# Patient Record
Sex: Female | Born: 1950 | Race: White | Hispanic: No | Marital: Single | Smoking: Former smoker
Health system: Southern US, Community
[De-identification: ages and names within clinical notes are randomized; demographics above are authoritative.]

## PROBLEM LIST (undated history)

## (undated) DIAGNOSIS — C801 Malignant (primary) neoplasm, unspecified: Secondary | ICD-10-CM

## (undated) HISTORY — PX: EYE SURGERY: SHX253

## (undated) HISTORY — PX: TUBAL LIGATION: SHX77

## (undated) HISTORY — PX: KIDNEY SURGERY: SHX687

## (undated) HISTORY — PX: ABDOMINAL HYSTERECTOMY: SHX81

## (undated) HISTORY — PX: CHOLECYSTECTOMY: SHX55

---

## 2019-08-30 ENCOUNTER — Telehealth: Payer: Self-pay | Admitting: Oncology

## 2019-08-30 NOTE — Telephone Encounter (Signed)
Received a referral from [facility] in [facility] for metastatic urothelial carcinoma. I cld and spoke to the pt's daughter in law and schedule Katelyn Sutton to see Dr. Alen Blew on 4/29 at 2pm.

## 2019-09-02 ENCOUNTER — Emergency Department (HOSPITAL_COMMUNITY)
Admission: EM | Admit: 2019-09-02 | Discharge: 2019-09-02 | Disposition: A | Discharge status: Home / Self Care | Payer: Medicare HMO | Attending: Emergency Medicine | Admitting: Emergency Medicine

## 2019-09-02 ENCOUNTER — Emergency Department (HOSPITAL_BASED_OUTPATIENT_CLINIC_OR_DEPARTMENT_OTHER)
Admit: 2019-09-02 | Discharge: 2019-09-02 | Disposition: A | Discharge status: Home / Self Care | Payer: Medicare HMO | Attending: Emergency Medicine | Admitting: Emergency Medicine

## 2019-09-02 ENCOUNTER — Emergency Department (HOSPITAL_COMMUNITY): Payer: Medicare HMO

## 2019-09-02 ENCOUNTER — Other Ambulatory Visit: Payer: Self-pay

## 2019-09-02 ENCOUNTER — Encounter (HOSPITAL_COMMUNITY): Payer: Self-pay

## 2019-09-02 DIAGNOSIS — Z79899 Other long term (current) drug therapy: Secondary | ICD-10-CM | POA: Insufficient documentation

## 2019-09-02 DIAGNOSIS — M7989 Other specified soft tissue disorders: Secondary | ICD-10-CM

## 2019-09-02 DIAGNOSIS — R2243 Localized swelling, mass and lump, lower limb, bilateral: Secondary | ICD-10-CM | POA: Diagnosis not present

## 2019-09-02 DIAGNOSIS — Z85841 Personal history of malignant neoplasm of brain: Secondary | ICD-10-CM | POA: Diagnosis not present

## 2019-09-02 DIAGNOSIS — R0602 Shortness of breath: Secondary | ICD-10-CM | POA: Diagnosis not present

## 2019-09-02 DIAGNOSIS — Z923 Personal history of irradiation: Secondary | ICD-10-CM | POA: Diagnosis not present

## 2019-09-02 DIAGNOSIS — R5383 Other fatigue: Secondary | ICD-10-CM | POA: Diagnosis not present

## 2019-09-02 DIAGNOSIS — Z87891 Personal history of nicotine dependence: Secondary | ICD-10-CM | POA: Insufficient documentation

## 2019-09-02 DIAGNOSIS — R600 Localized edema: Secondary | ICD-10-CM

## 2019-09-02 DIAGNOSIS — R609 Edema, unspecified: Secondary | ICD-10-CM

## 2019-09-02 HISTORY — DX: Malignant (primary) neoplasm, unspecified: C80.1

## 2019-09-02 LAB — CBC WITH DIFFERENTIAL/PLATELET
Abs Immature Granulocytes: 0.57 10*3/uL — ABNORMAL HIGH (ref 0.00–0.07)
Basophils Absolute: 0 10*3/uL (ref 0.0–0.1)
Basophils Relative: 0 %
Eosinophils Absolute: 0.1 10*3/uL (ref 0.0–0.5)
Eosinophils Relative: 1 %
HCT: 33.9 % — ABNORMAL LOW (ref 36.0–46.0)
Hemoglobin: 10.9 g/dL — ABNORMAL LOW (ref 12.0–15.0)
Immature Granulocytes: 5 %
Lymphocytes Relative: 8 %
Lymphs Abs: 0.9 10*3/uL (ref 0.7–4.0)
MCH: 30.1 pg (ref 26.0–34.0)
MCHC: 32.2 g/dL (ref 30.0–36.0)
MCV: 93.6 fL (ref 80.0–100.0)
Monocytes Absolute: 0.9 10*3/uL (ref 0.1–1.0)
Monocytes Relative: 8 %
Neutro Abs: 8.7 10*3/uL — ABNORMAL HIGH (ref 1.7–7.7)
Neutrophils Relative %: 78 %
Platelets: 159 10*3/uL (ref 150–400)
RBC: 3.62 MIL/uL — ABNORMAL LOW (ref 3.87–5.11)
RDW: 15.8 % — ABNORMAL HIGH (ref 11.5–15.5)
WBC: 11.1 10*3/uL — ABNORMAL HIGH (ref 4.0–10.5)
nRBC: 0 % (ref 0.0–0.2)

## 2019-09-02 MED ORDER — FUROSEMIDE 20 MG PO TABS
20.0000 mg | ORAL_TABLET | Freq: Every day | ORAL | 0 refills | Status: DC
Start: 2019-09-02 — End: 2019-09-08

## 2019-09-02 MED ORDER — FUROSEMIDE 10 MG/ML IJ SOLN
20.0000 mg | Freq: Once | INTRAMUSCULAR | Status: AC
  Administered 2019-09-02: 20 mg via INTRAVENOUS
  Filled 2019-09-02: qty 4

## 2019-09-02 MED ORDER — POTASSIUM CHLORIDE CRYS ER 20 MEQ PO TBCR
20.0000 meq | EXTENDED_RELEASE_TABLET | Freq: Every day | ORAL | 0 refills | Status: DC
Start: 2019-09-02 — End: 2019-09-08

## 2019-09-02 MED ORDER — IOHEXOL 350 MG/ML SOLN
100.0000 mL | Freq: Once | INTRAVENOUS | Status: AC | PRN
  Administered 2019-09-02: 100 mL via INTRAVENOUS

## 2019-09-02 NOTE — ED Provider Notes (Signed)
[facility] DEPT Provider Note   CSN: [id number] Arrival date & time: 09/02/19  1608     History Chief Complaint  Patient presents with  . Abnormal Lab    Katelyn Sutton is a 69 y.o. female.  Patient is a 69 year old female who presents with leg swelling and some shortness of breath.  She has a history of metastatic transitional urothelial carcinoma.  She has brain metastases.  She has been treated in [facility].  She recently completed a 10-day course of whole brain radiation.  Following that, the decision was made by her family to move her out here to [place].  She has been driving for the last 3 days from [place] and arrived here yesterday.  She said that she has been having worsening leg swelling since that time.  Her son says the leg swelling actually start while she was getting the radiation therapy.  She does have some shortness of breath at times although mostly when she is fatigued.  Over the last 2 weeks she has had some episodes where she gets chest tightness, mostly when she is fatigued.  It does not seem to be pleuritic.  It does not seem to be on exertion per se but more when she is fatigued.  Its bilateral.  She denies any fevers.  No cough or cold symptoms.  She is had a weight gain from 124 pounds to 141 over the last couple of weeks.  Of note, she was treated for hyponatremia as well while she was hospitalized.        Past Medical History:  Diagnosis Date  . Cancer ([place])     There are no problems to display for this patient.   Past Surgical History:  Procedure Laterality Date  . ABDOMINAL HYSTERECTOMY    . CHOLECYSTECTOMY    . EYE SURGERY    . KIDNEY SURGERY    . TUBAL LIGATION       OB History   No obstetric history on file.     Family History  Problem Relation Age of Onset  . Stroke Mother     Social History   Tobacco Use  . Smoking status: Former Research scientist (life sciences)  . Smokeless tobacco: Never Used  Substance Use  Topics  . Alcohol use: Yes  . Drug use: Never    Home Medications Prior to Admission medications   Medication Sig Start Date End Date Taking? Authorizing Provider  AMINO ACIDS COMPLEX PO Take 1 capsule by mouth daily.   Yes [provider]  dexamethasone (DECADRON) 2 MG tablet Take 2 mg by mouth every evening. 08/29/19  Yes [provider]  dexamethasone (DECADRON) 4 MG tablet Take 4 mg by mouth daily at 6 (six) AM. 08/18/19  Yes [provider]  famotidine (PEPCID) 20 MG tablet Take 20 mg by mouth 2 (two) times daily. 08/18/19  Yes [provider]  Misc Natural Products (JOINT SUPPORT COMPLEX PO) Take 1 capsule by mouth daily.   Yes [provider]  Multiple Vitamin (MULTI-VITAMIN) tablet Take 1 tablet by mouth daily.   Yes [provider]  traZODone (DESYREL) 50 MG tablet Take 50 mg by mouth at bedtime. 08/26/19  Yes [provider]  Turmeric (QC TUMERIC COMPLEX PO) Take 1 capsule by mouth at bedtime.   Yes [provider]  furosemide (LASIX) 20 MG tablet Take 1 tablet (20 mg total) by mouth daily for 3 days. 09/02/19 09/05/19  Malvin Johns, MD  potassium chloride  SA (KLOR-CON) 20 MEQ tablet Take 1 tablet (20 mEq total) by mouth daily. 09/02/19   Malvin Johns, MD    Allergies    Morphine and related  Review of Systems   Review of Systems  Constitutional: Positive for activity change, appetite change and fatigue. Negative for chills, diaphoresis and fever.  HENT: Negative for congestion, rhinorrhea and sneezing.   Eyes: Negative.   Respiratory: Positive for chest tightness and shortness of breath. Negative for cough.   Cardiovascular: Positive for leg swelling. Negative for chest pain.  Gastrointestinal: Negative for abdominal pain, blood in stool, diarrhea, nausea and vomiting.  Genitourinary: Negative for difficulty urinating, flank pain, frequency and hematuria.  Musculoskeletal: Negative for arthralgias and back  pain.  Skin: Negative for rash.  Neurological: Positive for weakness (generalized). Negative for dizziness, speech difficulty, numbness and headaches.    Physical Exam Updated Vital Signs BP 112/74   Pulse 70   Temp 98.2 F (36.8 C) (Oral)   Resp 15   Ht 5\' 7"  (1.702 m)   Wt 65.8 kg   SpO2 94%   BMI 22.71 kg/m   Physical Exam Constitutional:      Appearance: She is well-developed.  HENT:     Head: Normocephalic and atraumatic.  Eyes:     Pupils: Pupils are equal, round, and reactive to light.  Cardiovascular:     Rate and Rhythm: Normal rate and regular rhythm.     Heart sounds: Normal heart sounds.  Pulmonary:     Effort: Pulmonary effort is normal. No respiratory distress.     Breath sounds: Normal breath sounds. No wheezing or rales.  Chest:     Chest wall: No tenderness.  Abdominal:     General: Bowel sounds are normal.     Palpations: Abdomen is soft.     Tenderness: There is no abdominal tenderness. There is no guarding or rebound.  Musculoskeletal:        General: Normal range of motion.     Cervical back: Normal range of motion and neck supple.     Right lower leg: Edema present.     Left lower leg: Edema present.     Comments: 2-3+ pitting edema into the bilateral lower extremities, pedal pulses are intact, no warmth or erythema  Lymphadenopathy:     Cervical: No cervical adenopathy.  Skin:    General: Skin is warm and dry.     Findings: No rash.  Neurological:     Mental Status: She is alert and oriented to person, place, and time.     ED Results / Procedures / Treatments   Labs (all labs ordered are listed, but only abnormal results are displayed) Labs Reviewed  CBC WITH DIFFERENTIAL/PLATELET - Abnormal; Notable for the following components:      Result Value   WBC 11.1 (*)    RBC 3.62 (*)    Hemoglobin 10.9 (*)    HCT 33.9 (*)    RDW 15.8 (*)    Neutro Abs 8.7 (*)    Abs Immature Granulocytes 0.57 (*)    All other components within normal  limits    EKG EKG Interpretation  Date/Time:  Friday September 02 2019 17:44:45 EDT Ventricular Rate:  76 PR Interval:    QRS Duration: 93 QT Interval:  384 QTC Calculation: 432 R Axis:   66 Text Interpretation: Sinus rhythm Probable left atrial enlargement Anteroseptal infarct, age indeterminate No old tracing to compare Confirmed by Malvin Johns [phone]) on 09/02/2019 6:50:36 PM  Radiology CT Angio Chest PE W/Cm &/Or Wo Cm  Result Date: 09/02/2019 CLINICAL DATA:  Shortness of breath, elevated D-dimer, bilateral lower extremity edema, history of brain cancer with liver and lung metastases EXAM: CT ANGIOGRAPHY CHEST WITH CONTRAST TECHNIQUE: Multidetector CT imaging of the chest was performed using the standard protocol during bolus administration of intravenous contrast. Multiplanar CT image reconstructions and MIPs were obtained to evaluate the vascular anatomy. CONTRAST:  168mL OMNIPAQUE IOHEXOL 350 MG/ML SOLN COMPARISON:  None. FINDINGS: Cardiovascular: This is a technically adequate evaluation of the pulmonary vasculature. No filling defects or pulmonary emboli. The heart is unremarkable without pericardial effusion. Normal caliber of the thoracic aorta. Mediastinum/Nodes: Left hilar adenopathy measures up to 2.4 cm in short axis. Thyroid, trachea, and esophagus are unremarkable. Lungs/Pleura: There is a 9 mm right middle lobe pulmonary nodule reference image 85. A large cavitating mass within the left upper lobe measures up to 4.3 x 3.0 cm. Findings are consistent with metastatic disease. There is upper lobe predominant emphysema. No airspace disease, effusion, or pneumothorax. Central airways are patent. Upper Abdomen: Peripheral hypodensity right lobe liver reference image 136 compatible with metastatic disease, measuring 2.1 cm in size. Left renal cyst also noted. Musculoskeletal: Lytic lesion within the L1 vertebral body consistent with bony metastatic disease. Pathologic fracture through  the superior endplate with approximately 25% loss of height. No retropulsion. No other acute or destructive bony lesions. Reconstructed images demonstrate no additional findings. Review of the MIP images confirms the above findings. IMPRESSION: 1. No evidence of pulmonary embolus. 2. Right middle lobe nodule, left upper lobe mass, and left hilar adenopathy consistent with intrathoracic metastases. 3. Lytic lesion within the L1 vertebral body consistent with bony metastatic disease. Pathologic fracture through the superior endplate with approximately 25% loss of height. No retropulsion. 4. Right lobe liver metastatic focus. 5. Emphysema (ICD10-J43.9). Electronically Signed   By: Randa Ngo M.D.   On: 09/02/2019 19:51   VAS [place] LOWER EXTREMITY VENOUS (DVT) (ONLY [facility] & [facility] 7a-7p)  Result Date: 09/02/2019  Lower Venous DVTStudy Indications: Swelling.  Risk Factors: Cancer. Limitations: Poor ultrasound/tissue interface. Comparison Study: No prior studies. Performing Technologist: Oliver Hum RVT  Examination Guidelines: A complete evaluation includes B-mode imaging, spectral Doppler, color Doppler, and power Doppler as needed of all accessible portions of each vessel. Bilateral testing is considered an integral part of a complete examination. Limited examinations for reoccurring indications may be performed as noted. The reflux portion of the exam is performed with the patient in reverse Trendelenburg.  +---------+---------------+---------+-----------+----------+--------------+ RIGHT    CompressibilityPhasicitySpontaneityPropertiesThrombus Aging +---------+---------------+---------+-----------+----------+--------------+ CFV      Full           Yes      Yes                                 +---------+---------------+---------+-----------+----------+--------------+ SFJ      Full                                                         +---------+---------------+---------+-----------+----------+--------------+ FV Prox  Full                                                        +---------+---------------+---------+-----------+----------+--------------+  FV Mid   Full                                                        +---------+---------------+---------+-----------+----------+--------------+ FV DistalFull                                                        +---------+---------------+---------+-----------+----------+--------------+ PFV      Full                                                        +---------+---------------+---------+-----------+----------+--------------+ POP      Full           Yes      Yes                                 +---------+---------------+---------+-----------+----------+--------------+ PTV      Full                                                        +---------+---------------+---------+-----------+----------+--------------+ PERO     Full                                                        +---------+---------------+---------+-----------+----------+--------------+   +---------+---------------+---------+-----------+----------+--------------+ LEFT     CompressibilityPhasicitySpontaneityPropertiesThrombus Aging +---------+---------------+---------+-----------+----------+--------------+ CFV      Full           Yes      Yes                                 +---------+---------------+---------+-----------+----------+--------------+ SFJ      Full                                                        +---------+---------------+---------+-----------+----------+--------------+ FV Prox  Full                                                        +---------+---------------+---------+-----------+----------+--------------+ FV Mid   Full                                                         +---------+---------------+---------+-----------+----------+--------------+  FV DistalFull                                                        +---------+---------------+---------+-----------+----------+--------------+ PFV      Full                                                        +---------+---------------+---------+-----------+----------+--------------+ POP      Full           Yes      Yes                                 +---------+---------------+---------+-----------+----------+--------------+ PTV      Full                                                        +---------+---------------+---------+-----------+----------+--------------+ PERO     Full                                                        +---------+---------------+---------+-----------+----------+--------------+     Summary: RIGHT: - There is no evidence of deep vein thrombosis in the lower extremity.  - No cystic structure found in the popliteal fossa.  LEFT: - There is no evidence of deep vein thrombosis in the lower extremity.  - No cystic structure found in the popliteal fossa.  *See table(s) above for measurements and observations.    Preliminary     Procedures Procedures (including critical care time)  Medications Ordered in ED Medications  furosemide (LASIX) injection 20 mg (has no administration in time range)  iohexol (OMNIPAQUE) 350 MG/ML injection 100 mL (100 mLs Intravenous Contrast Given 09/02/19 1923)    ED Course  I have reviewed the triage vital signs and the nursing notes.  Pertinent labs & imaging results that were available during my care of the patient were reviewed by me and considered in my medical decision making (see chart for details).    MDM Rules/Calculators/A&P                      Patient is a 69 year old female who presents with peripheral edema.  She has some mild shortness of breath.  No evidence of pulmonary edema.  She had bilateral Doppler  ultrasounds which showed no evidence of DVT.  She had a CT scan of her chest which shows no evidence of PE.  There is some metastatic lung disease in her chest which she said she already knows about.  She was currently getting her treatment in [place].  She has an appointment on Thursday to follow-up with Dr. Alen Blew.  I reviewed her labs from earlier today.  She had a normal creatinine at 0.72.  Sodium was 130.  Potassium was 4.6.  Her BNP was normal at 61.  Her D-dimer was elevated which prompted the CT of her chest.  She otherwise is well-appearing.  She has no hypoxia.  She is ready to go home.  Her swelling could be related to the high-dose steroids that she has been recently in combination with mildly low albumin.  She has an appointment next week with Dr. Alen Blew.  Return precautions were given.  I will put her on a short course of Lasix.  Return precautions were given. Final Clinical Impression(s) / ED Diagnoses Final diagnoses:  Peripheral edema    Rx / DC Orders ED Discharge Orders         Ordered    furosemide (LASIX) 20 MG tablet  Daily     09/02/19 2106    potassium chloride SA (KLOR-CON) 20 MEQ tablet  Daily     09/02/19 2107           Malvin Johns, MD 09/02/19 2110

## 2019-09-02 NOTE — ED Triage Notes (Signed)
Patient states she had an elevated d-dimer. Patient has swelling of bilateral feet x 3 days. Patient arrived from [place] yesterday. Patient was 3 days in a car. Patient states she has brain cancer.

## 2019-09-02 NOTE — Progress Notes (Signed)
Bilateral lower extremity venous duplex has been completed. Preliminary results can be found in CV Proc through chart review.  Results were given to Dr. Tamera Punt.  09/02/19 6:03 PM Carlos Levering RVT

## 2019-09-02 NOTE — ED Notes (Addendum)
Pt ambulated to and from [facility] using her cane and without staff assistance.

## 2019-09-08 ENCOUNTER — Inpatient Hospital Stay: Payer: Medicare HMO | Attending: Oncology | Admitting: Oncology

## 2019-09-08 ENCOUNTER — Other Ambulatory Visit: Payer: Self-pay

## 2019-09-08 ENCOUNTER — Telehealth: Payer: Self-pay | Admitting: Oncology

## 2019-09-08 VITALS — BP 85/69 | HR 95 | Temp 98.9°F | Resp 17 | Ht 67.0 in | Wt 142.0 lb

## 2019-09-08 DIAGNOSIS — C7931 Secondary malignant neoplasm of brain: Secondary | ICD-10-CM

## 2019-09-08 DIAGNOSIS — Z7189 Other specified counseling: Secondary | ICD-10-CM

## 2019-09-08 DIAGNOSIS — C787 Secondary malignant neoplasm of liver and intrahepatic bile duct: Secondary | ICD-10-CM

## 2019-09-08 DIAGNOSIS — C649 Malignant neoplasm of unspecified kidney, except renal pelvis: Secondary | ICD-10-CM

## 2019-09-08 MED ORDER — POTASSIUM CHLORIDE CRYS ER 20 MEQ PO TBCR: 20.0000 meq | EXTENDED_RELEASE_TABLET | Freq: Every day | ORAL | 3 refills | Status: AC

## 2019-09-08 MED ORDER — FUROSEMIDE 20 MG PO TABS: 40.0000 mg | ORAL_TABLET | Freq: Every day | ORAL | 0 refills | Status: AC

## 2019-09-08 MED ORDER — LIDOCAINE-PRILOCAINE 2.5-2.5 % EX CREA
1.0000 | TOPICAL_CREAM | CUTANEOUS | 0 refills | Status: AC | PRN
Start: 2019-09-08 — End: ?

## 2019-09-08 MED ORDER — PROCHLORPERAZINE MALEATE 10 MG PO TABS
10.0000 mg | ORAL_TABLET | Freq: Four times a day (QID) | ORAL | 0 refills | Status: AC | PRN
Start: 2019-09-08 — End: ?

## 2019-09-08 NOTE — Progress Notes (Signed)
START ON PATHWAY REGIMEN - Bladder     A cycle is 21 days:     Pembrolizumab   **Always confirm dose/schedule in your pharmacy ordering system**  Patient Characteristics: Advanced/Metastatic Disease, First Line, No Prior Platinum-Based Therapy, Poor Renal Function (CrCl < 50 mL/min), Unknown PD-L1 Expression Therapeutic Status: Advanced/Metastatic Disease Line of Therapy: First Line Prior Platinum-Based Therapy<= No Renal Function: Poor Renal Function (CrCl < 50 mL/min) PD-L1 Expression Status: Unknown PD-L1 Expression Intent of Therapy: Non-Curative / Palliative Intent, Discussed with Patient

## 2019-09-08 NOTE — Progress Notes (Signed)
Reason for the request: Urothelial carcinoma.  HPI: I was asked by Dr. Shon Baton to evaluate [name] for evaluation of advanced malignancy.  She is a 69 year old woman presented to a hospital in [place] with symptoms of confusion for the last week on August 03, 2019.  Evaluation at that time showed multiple brain lesions consistent with advanced malignancy.  Her work-up included MRI of the brain which showed multiple ring-enhancing lesions within the left frontal lobe left temporal lobe left parietal lobe with the largest measuring 2.4 cm.  Extensive vasogenic edema was noted at that time.  She was started on dexamethasone and imaging studies showed hepatic lesion as well as pulmonary masses.  CT-guided biopsy of her pulmonary lesions were nondiagnostic and underwent hepatic biopsy that was completed during her hospitalization.  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  Immunohistochemical stains supports the diagnosis of metastasis urothelial carcinoma may arise from the renal pelvis, ureter or urinary bladder.  She received whole brain radiation was transferred care [facility] to be with close to the past.  She does have a history of renal cell carcinoma and status post right nephrectomy.  Her left kidney did not show any clear-cut malignancy on imaging  She was seen in the emergency department locally on September 02, 2019 for lower extremity edema.  CT scan of the chest showed no evidence of pulmonary embolus with a right middle lobe nodule left upper lobe mass and left hilar adenopathy consistent with metastatic disease.  Lytic lesion with L1 vertebral body consistent with bony metastasis with pathological fracture of the superior endplate was also noted.  Right lobe liver metastasis was also was noted.  No evidence of deep vein thrombosis was noted.  Clinically, she reports lower extremity edema and  occasional insomnia.  She has been eating okay and ambulating with the help of a cane.  She denies any recent falls or syncope.  Denies shortness of breath or cough.  She does not report any headaches, blurry vision, syncope or seizures. Does not report any fevers, chills or sweats.  Does not report any cough, wheezing or hemoptysis.  Does not report any chest pain, palpitation, orthopnea or leg edema.  Does not report any nausea, vomiting or abdominal pain.  Does not report any constipation or diarrhea.  Does not report any skeletal complaints.    Does not report frequency, urgency or hematuria.  Does not report any skin rashes or lesions. Does not report any heat or cold intolerance.  Does not report any lymphadenopathy or petechiae.  Does not report any anxiety or depression.  Remaining review of systems is negative.    Past Medical History:  Diagnosis Date  . Cancer [facility])   :  Past Surgical History:  Procedure Laterality Date  . ABDOMINAL HYSTERECTOMY    . CHOLECYSTECTOMY    . EYE SURGERY    . KIDNEY SURGERY    . TUBAL LIGATION    :   Current Outpatient Medications:  .  AMINO ACIDS COMPLEX PO, Take 1 capsule by mouth daily., Disp: , Rfl:  .  dexamethasone (DECADRON) 2 MG tablet, Take 2 mg by mouth every evening., Disp: , Rfl:  .  dexamethasone (DECADRON) 4 MG tablet, Take 4 mg by mouth daily at 6 (six) AM., Disp: , Rfl:  .  famotidine (PEPCID) 20 MG tablet, Take 20 mg by mouth 2 (two) times daily., Disp: , Rfl:  .  furosemide (LASIX) 20 MG tablet, Take 1 tablet (20 mg total) by mouth daily for 3 days., Disp: 3 tablet, Rfl: 0 .  Misc Natural Products (JOINT SUPPORT COMPLEX PO), Take 1 capsule by mouth daily., Disp: , Rfl:  .  Multiple Vitamin (MULTI-VITAMIN) tablet, Take 1 tablet by mouth daily., Disp: , Rfl:  .  potassium chloride SA (KLOR-CON) 20 MEQ tablet, Take 1 tablet (20 mEq total) by mouth daily., Disp: 3 tablet, Rfl: 0 .  traZODone (DESYREL) 50 MG tablet, Take 50 mg by mouth  at bedtime., Disp: , Rfl:  .  Turmeric (QC TUMERIC COMPLEX PO), Take 1 capsule by mouth at bedtime., Disp: , Rfl: :  Allergies  Allergen Reactions  . Morphine And Related Itching  :  Family History  Problem Relation Age of Onset  . Stroke Mother   :  Social History   Socioeconomic History  . Marital status: Single    Spouse name: Not on file  . Number of children: Not on file  . Years of education: Not on file  . Highest education level: Not on file  Occupational History  . Not on file  Tobacco Use  . Smoking status: Former Research scientist (life sciences)  . Smokeless tobacco: Never Used  Substance and Sexual Activity  . Alcohol use: Yes  . Drug use: Never  . Sexual activity: Not on file  Other Topics Concern  . Not on file  Social History Narrative  . Not on file   Social Determinants of Health   Financial Resource Strain:   . Difficulty of Paying Living Expenses:   Food Insecurity:   . Worried About Charity fundraiser in the Last Year:   . Arboriculturist in the Last Year:   Transportation Needs:   . Film/video editor (Medical):   Marland Kitchen Lack of Transportation (Non-Medical):   Physical Activity:   . Days of Exercise per Week:   . Minutes of Exercise per Session:   Stress:   . Feeling of Stress :   Social Connections:   . Frequency of Communication with Friends and Family:   . Frequency of Social Gatherings with Friends and Family:   . Attends Religious Services:   . Active Member of Clubs or Organizations:   . Attends Archivist Meetings:   Marland Kitchen Marital Status:   Intimate Partner Violence:   . Fear of Current or Ex-Partner:   . Emotionally Abused:   Marland Kitchen Physically Abused:   . Sexually Abused:   :  Pertinent items are noted in HPI.  Exam: Blood pressure (!) 85/69, pulse 95, temperature 98.9 F (37.2 C), temperature source Temporal, resp. rate 17, height 5\' 7"  (1.702 m), weight 142 lb (64.4 kg), SpO2 97 %.  ECOG 2  General appearance: alert and cooperative  appeared without distress. Head: atraumatic without any abnormalities. Eyes: conjunctivae/corneas clear. PERRL.  Sclera anicteric. Throat: lips, mucosa, and tongue normal; without oral thrush or ulcers. Resp: clear to auscultation bilaterally without rhonchi, wheezes or dullness to percussion. Cardio: regular rate and rhythm, S1, S2 normal, no murmur, click, rub or gallop.  Bilateral lower extremity edema noted. GI: soft, non-tender; bowel sounds normal; no masses,  no organomegaly Skin: Skin color, texture, turgor normal. No rashes or lesions Lymph nodes: Cervical, supraclavicular, and axillary nodes normal. Neurologic: Grossly normal without any motor, sensory or deep tendon reflexes. Musculoskeletal: No joint deformity or effusion.    CT Angio Chest PE W/Cm &/Or Wo Cm  Result Date: 09/02/2019  CLINICAL DATA:  Shortness of breath, elevated D-dimer, bilateral lower extremity edema, history of brain cancer with liver and lung metastases EXAM: CT ANGIOGRAPHY CHEST WITH CONTRAST TECHNIQUE: Multidetector CT imaging of the chest was performed using the standard protocol during bolus administration of intravenous contrast. Multiplanar CT image reconstructions and MIPs were obtained to evaluate the vascular anatomy. CONTRAST:  158mL OMNIPAQUE IOHEXOL 350 MG/ML SOLN COMPARISON:  None. FINDINGS: Cardiovascular: This is a technically adequate evaluation of the pulmonary vasculature. No filling defects or pulmonary emboli. The heart is unremarkable without pericardial effusion. Normal caliber of the thoracic aorta. Mediastinum/Nodes: Left hilar adenopathy measures up to 2.4 cm in short axis. Thyroid, trachea, and esophagus are unremarkable. Lungs/Pleura: There is a 9 mm right middle lobe pulmonary nodule reference image 85. A large cavitating mass within the left upper lobe measures up to 4.3 x 3.0 cm. Findings are consistent with metastatic disease. There is upper lobe predominant emphysema. No airspace  disease, effusion, or pneumothorax. Central airways are patent. Upper Abdomen: Peripheral hypodensity right lobe liver reference image 136 compatible with metastatic disease, measuring 2.1 cm in size. Left renal cyst also noted. Musculoskeletal: Lytic lesion within the L1 vertebral body consistent with bony metastatic disease. Pathologic fracture through the superior endplate with approximately 25% loss of height. No retropulsion. No other acute or destructive bony lesions. Reconstructed images demonstrate no additional findings. Review of the MIP images confirms the above findings. IMPRESSION: 1. No evidence of pulmonary embolus. 2. Right middle lobe nodule, left upper lobe mass, and left hilar adenopathy consistent with intrathoracic metastases. 3. Lytic lesion within the L1 vertebral body consistent with bony metastatic disease. Pathologic fracture through the superior endplate with approximately 25% loss of height. No retropulsion. 4. Right lobe liver metastatic focus. 5. Emphysema (ICD10-J43.9). Electronically Signed   By: Randa Ngo M.D.   On: 09/02/2019 19:51   VAS [place] LOWER EXTREMITY VENOUS (DVT) (ONLY [facility] & [facility] 7a-7p)  Result Date: 09/04/2019  Lower Venous DVTStudy Indications: Swelling.  Limitations: Poor ultrasound/tissue interface. Comparison Study: No prior studies. Performing Technologist: Oliver Hum RVT  Examination Guidelines: A complete evaluation includes B-mode imaging, spectral Doppler, color Doppler, and power Doppler as needed of all accessible portions of each vessel. Bilateral testing is considered an integral part of a complete examination. Limited examinations for reoccurring indications may be performed as noted. The reflux portion of the exam is performed with the patient in reverse Trendelenburg.  +---------+---------------+---------+-----------+----------+--------------+ RIGHT    CompressibilityPhasicitySpontaneityPropertiesThrombus Aging  +---------+---------------+---------+-----------+----------+--------------+ CFV      Full           Yes      Yes                                 +---------+---------------+---------+-----------+----------+--------------+ SFJ      Full                                                        +---------+---------------+---------+-----------+----------+--------------+ FV Prox  Full                                                        +---------+---------------+---------+-----------+----------+--------------+  FV Mid   Full                                                        +---------+---------------+---------+-----------+----------+--------------+ FV DistalFull                                                        +---------+---------------+---------+-----------+----------+--------------+ PFV      Full                                                        +---------+---------------+---------+-----------+----------+--------------+ POP      Full           Yes      Yes                                 +---------+---------------+---------+-----------+----------+--------------+ PTV      Full                                                        +---------+---------------+---------+-----------+----------+--------------+ PERO     Full                                                        +---------+---------------+---------+-----------+----------+--------------+   +---------+---------------+---------+-----------+----------+--------------+ LEFT     CompressibilityPhasicitySpontaneityPropertiesThrombus Aging +---------+---------------+---------+-----------+----------+--------------+ CFV      Full           Yes      Yes                                 +---------+---------------+---------+-----------+----------+--------------+ SFJ      Full                                                         +---------+---------------+---------+-----------+----------+--------------+ FV Prox  Full                                                        +---------+---------------+---------+-----------+----------+--------------+ FV Mid   Full                                                        +---------+---------------+---------+-----------+----------+--------------+  FV DistalFull                                                        +---------+---------------+---------+-----------+----------+--------------+ PFV      Full                                                        +---------+---------------+---------+-----------+----------+--------------+ POP      Full           Yes      Yes                                 +---------+---------------+---------+-----------+----------+--------------+ PTV      Full                                                        +---------+---------------+---------+-----------+----------+--------------+ PERO     Full                                                        +---------+---------------+---------+-----------+----------+--------------+     Summary: RIGHT: - There is no evidence of deep vein thrombosis in the lower extremity.  - No cystic structure found in the popliteal fossa.  LEFT: - There is no evidence of deep vein thrombosis in the lower extremity.  - No cystic structure found in the popliteal fossa.  *See table(s) above for measurements and observations. Electronically signed by Harold Barban MD on 09/04/2019 at 11:43:26 AM.    Final     Assessment and Plan:   69 year old woman with:  1.  Advanced poorly differentiated carcinoma likely arising from a urothelial primary with metastatic disease documented in March 2020.  She presented with CNS metastasis, hepatic metastasis as well as pulmonary involvement parenchymal and lymph node liver biopsy obtained at [facility] in [facility]  confirmed the presence high-grade urothelial carcinoma.  The natural course of this disease was reviewed at this time and treatment options were discussed.  At this point any therapy would be palliative given the fact that we are dealing with and curable malignancy.  Treatment will be in the form of systemic chemotherapy using platinum based therapy versus immunotherapy in the form of single agent Pembrolizumab or nivolumab.  The logistics and rationale for using chemotherapy was reviewed today in detail.  Complication associated with carboplatin and gemcitabine chemotherapy was discussed.  These complications include nausea, vomiting, myelosuppression, fatigue, infusion related complications, renal insufficiency, neutropenia, neutropenic sepsis and rarely serious thrombosis, hospitalization and death.    Complications associated with immunotherapy include pruritus, dermatitis, GI toxicity as well as immune mediated complications.      After discussion today, he is agreeable to proceed with Pembrolizumab after ducation class.     2.  IV  access: Risks and benefits of using Port-A-Cath versus peripheral veins was discussed today.  Complication associated with Port-A-Cath insertion include bleeding, infection and thrombosis.  After discussing the risks and benefits, she is agreeable to proceed.   3.  Antiemetics: Prescription for Compazine was made available to him.   4.  Renal function surveillance:  We will assess her prior to starting based therapy.   5.  Goals of care:  Palliative at this time.  Her prognosis is overall guarded with limited life expectancy.  She understands if she does not tolerate treatment or treatment is unsuccessful she will need to be referred to hospice.  6.  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  We will repeat imaging studies in the future to assess response.   7.  Follow-up: will be in the immediate future to start chemotherapy.  60   minutes were dedicated to this visit. The time was spent on reviewing laboratory data, imaging studies, discussing treatment options, and answering questions regarding future plan.     A copy of this consult has been forwarded to the requesting physician.

## 2019-09-08 NOTE — Telephone Encounter (Signed)
No los per 4/29. °

## 2019-09-12 ENCOUNTER — Inpatient Hospital Stay: Payer: Medicare HMO

## 2019-09-12 ENCOUNTER — Telehealth: Payer: Self-pay | Admitting: Oncology

## 2019-09-12 MED ORDER — BISACODYL 10 MG RE SUPP
10.00 | RECTAL | Status: DC
Start: ? — End: 2019-09-12

## 2019-09-12 MED ORDER — LORAZEPAM 2 MG/ML IJ SOLN
2.00 | INTRAMUSCULAR | Status: DC
Start: ? — End: 2019-09-12

## 2019-09-12 MED ORDER — HYDROMORPHONE HCL 1 MG/ML IJ SOLN
0.40 | INTRAMUSCULAR | Status: DC
Start: ? — End: 2019-09-12

## 2019-09-12 MED ORDER — ONDANSETRON HCL 4 MG/2ML IJ SOLN
4.00 | INTRAMUSCULAR | Status: DC
Start: ? — End: 2019-09-12

## 2019-09-12 MED ORDER — POLYVINYL ALCOHOL 1.4 % OP SOLN
1.00 | OPHTHALMIC | Status: DC
Start: ? — End: 2019-09-12

## 2019-09-12 MED ORDER — POLYETHYLENE GLYCOL 3350 17 GM/SCOOP PO POWD
17.00 | ORAL | Status: DC
Start: ? — End: 2019-09-12

## 2019-09-12 MED ORDER — GENERIC EXTERNAL MEDICATION
Status: DC
Start: ? — End: 2019-09-12

## 2019-09-12 MED ORDER — FUROSEMIDE 10 MG/ML IJ SOLN
20.00 | INTRAMUSCULAR | Status: DC
Start: 2019-09-12 — End: 2019-09-12

## 2019-09-12 MED ORDER — LORAZEPAM 2 MG/ML IJ SOLN
1.00 | INTRAMUSCULAR | Status: DC
Start: ? — End: 2019-09-12

## 2019-09-12 NOTE — Telephone Encounter (Signed)
Called pt son per 5/3 sch message to r/s appt . Spoke with son and he said to cancel all appts , pt is not well and they will not be getting treatment. Cancelled appts and sent message to dr. Alen Blew so it is aware.

## 2019-09-13 ENCOUNTER — Ambulatory Visit: Payer: Medicare HMO

## 2019-09-13 ENCOUNTER — Other Ambulatory Visit: Payer: Medicare HMO

## 2019-09-13 MED ORDER — GENERIC EXTERNAL MEDICATION
Status: DC
Start: ? — End: 2019-09-13

## 2019-09-14 ENCOUNTER — Other Ambulatory Visit (HOSPITAL_COMMUNITY): Payer: Medicare HMO

## 2019-09-14 ENCOUNTER — Ambulatory Visit (HOSPITAL_COMMUNITY): Payer: Medicare HMO

## 2019-09-19 ENCOUNTER — Other Ambulatory Visit: Payer: Medicare HMO

## 2019-09-19 ENCOUNTER — Ambulatory Visit: Payer: Medicare HMO

## 2019-10-04 ENCOUNTER — Ambulatory Visit: Payer: Medicare HMO

## 2019-10-04 ENCOUNTER — Ambulatory Visit: Payer: Medicare HMO | Admitting: Oncology

## 2019-10-04 ENCOUNTER — Other Ambulatory Visit: Payer: Medicare HMO

## 2019-10-11 ENCOUNTER — Ambulatory Visit: Payer: Medicare HMO | Admitting: Oncology

## 2019-10-11 ENCOUNTER — Other Ambulatory Visit: Payer: Medicare HMO

## 2019-10-11 ENCOUNTER — Ambulatory Visit: Payer: Medicare HMO

## 2019-10-11 DEATH — deceased

## 2019-10-25 ENCOUNTER — Ambulatory Visit: Payer: Medicare HMO | Admitting: Oncology

## 2019-10-25 ENCOUNTER — Other Ambulatory Visit: Payer: Medicare HMO

## 2019-10-25 ENCOUNTER — Ambulatory Visit: Payer: Medicare HMO

## 2019-11-01 ENCOUNTER — Ambulatory Visit: Payer: Medicare HMO | Admitting: Oncology

## 2019-11-01 ENCOUNTER — Other Ambulatory Visit: Payer: Medicare HMO

## 2019-11-01 ENCOUNTER — Ambulatory Visit: Payer: Medicare HMO

## 2020-12-25 IMAGING — CT CT ANGIO CHEST
2 of 6 series · 18 of 36 positions shown · IV contrast (omnipaque)
Comparison: None.

CLINICAL DATA: Shortness of breath, elevated D-dimer, bilateral
lower extremity edema, history of brain cancer with liver and lung
metastases

EXAM:
CT ANGIOGRAPHY CHEST WITH CONTRAST
TECHNIQUE: Multidetector CT imaging of the chest was performed using the
standard protocol during bolus administration of intravenous
contrast. Multiplanar CT image reconstructions and MIPs were
obtained to evaluate the vascular anatomy.
CONTRAST:  100mL OMNIPAQUE IOHEXOL 350 MG/ML SOLN

[Series 5: thins · axial · 0.64mm/px · z∈[-437,-170]mm · 17 of 301 slices shown]
[im 17/301  lung]
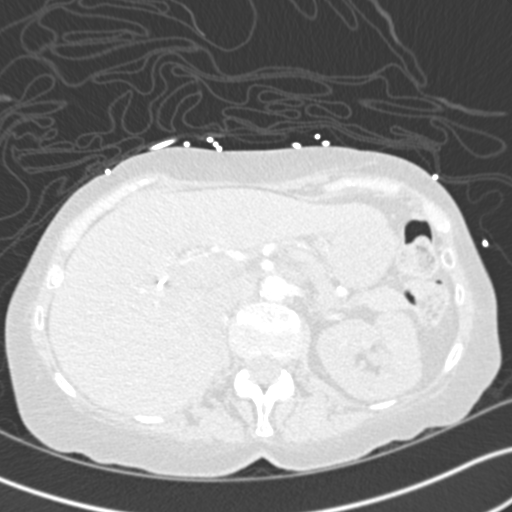
[im 34/301  mediastinal]
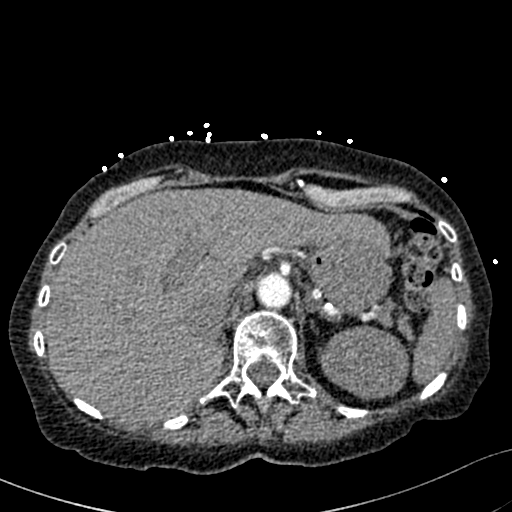
[im 51/301  lung]
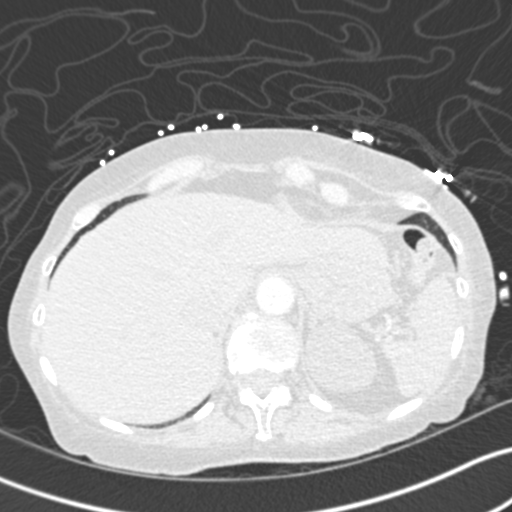
[im 67/301  mediastinal]
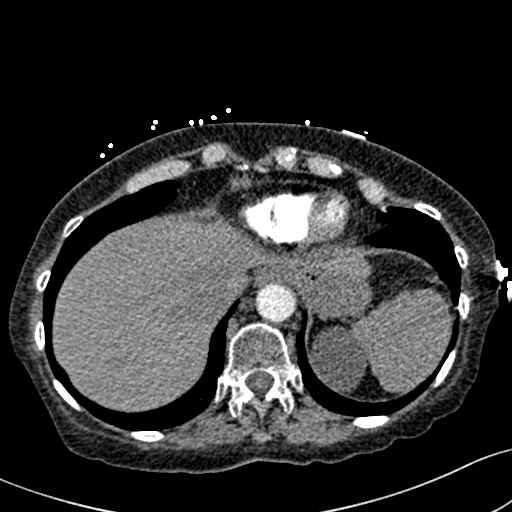
[im 84/301  lung]
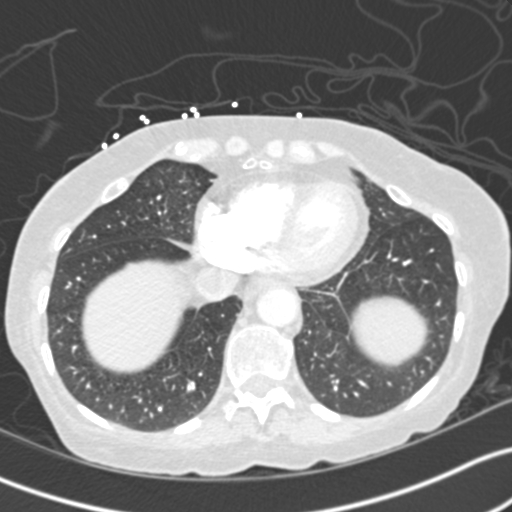
[im 101/301  mediastinal]
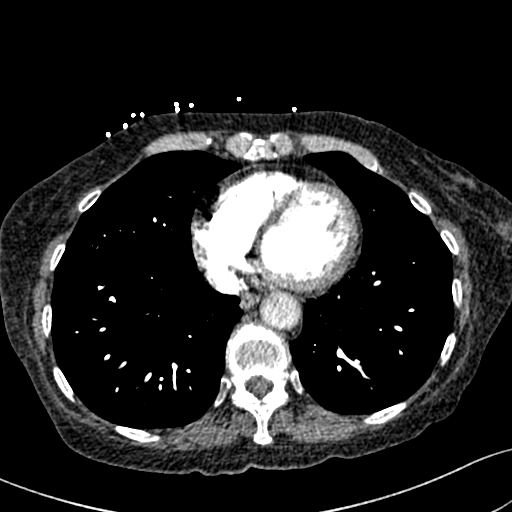
[im 117/301  lung]
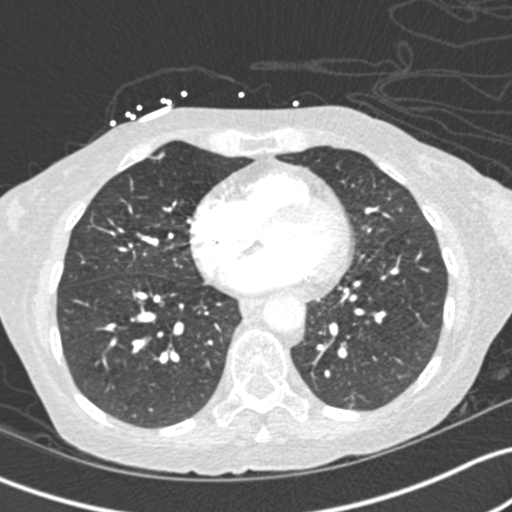
[im 134/301  mediastinal]
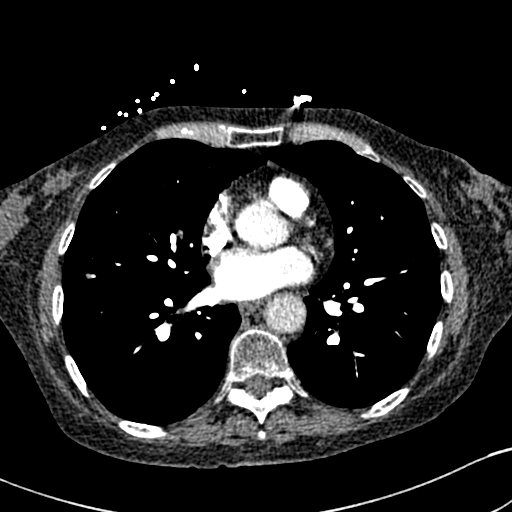
[im 151/301  lung]
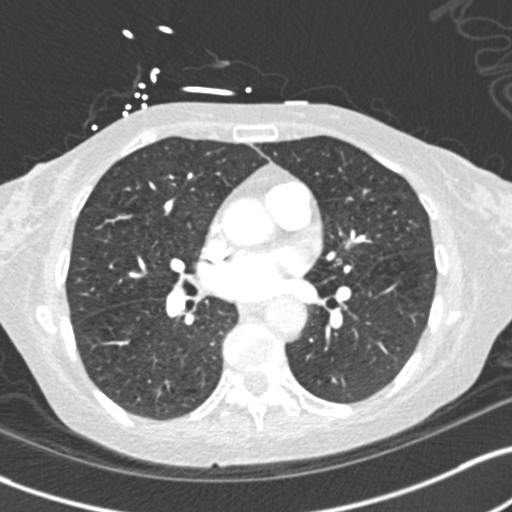
[im 167/301  mediastinal]
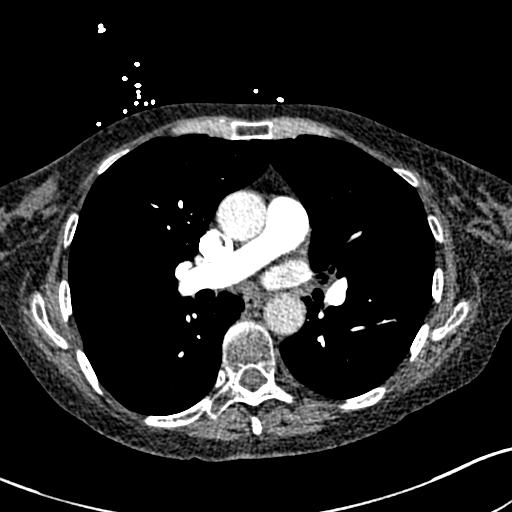
[im 184/301  lung]
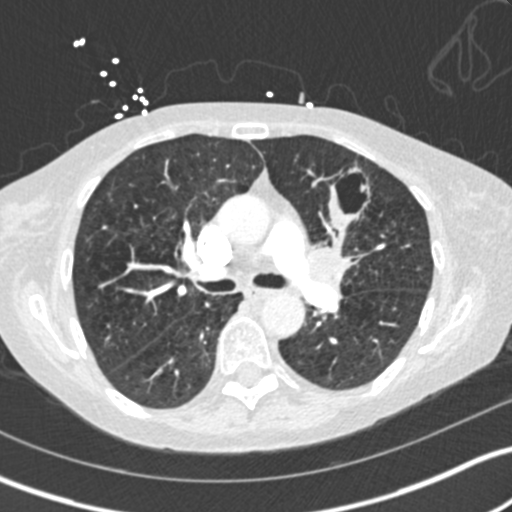
[im 201/301  mediastinal]
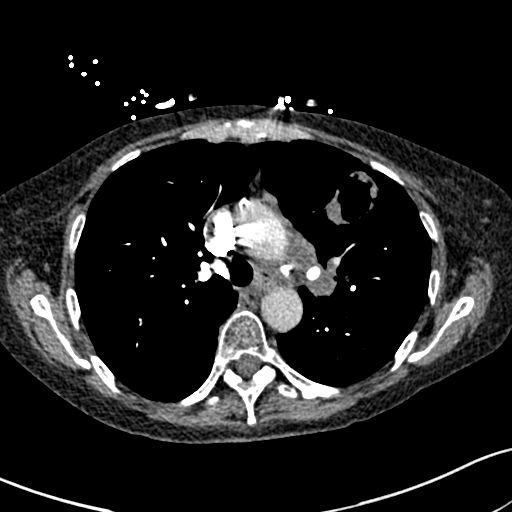
[im 217/301  lung]
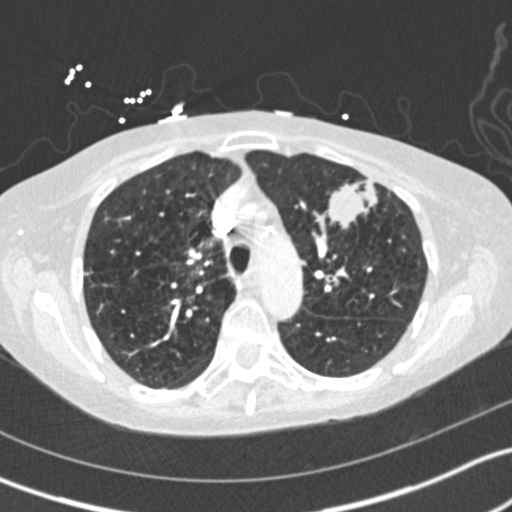
[im 234/301  mediastinal]
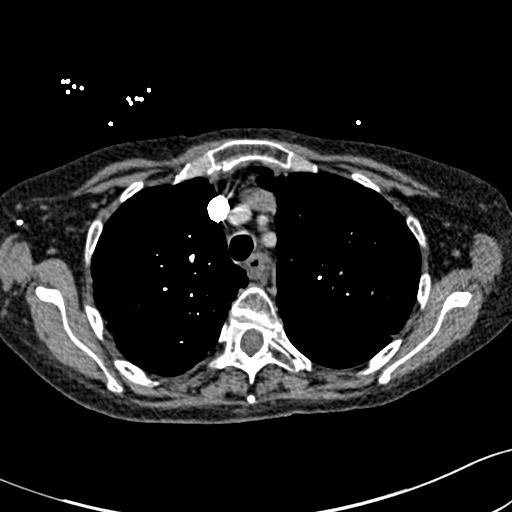
[im 251/301  lung]
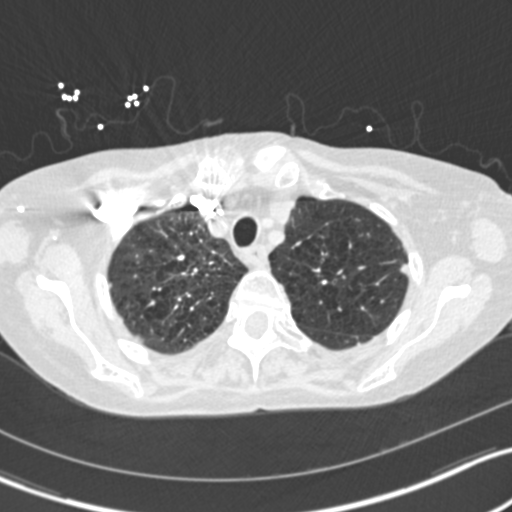
[im 267/301  mediastinal]
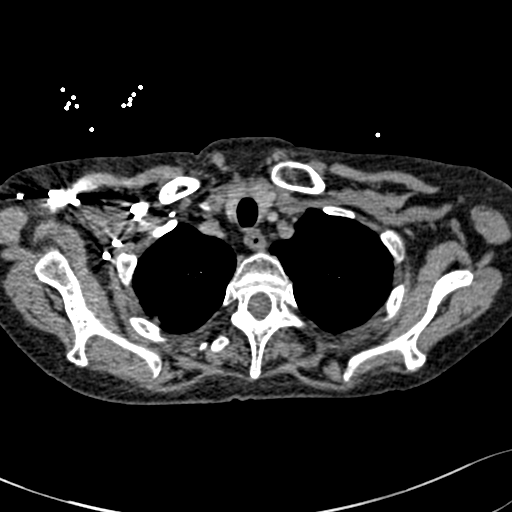
[im 284/301  lung]
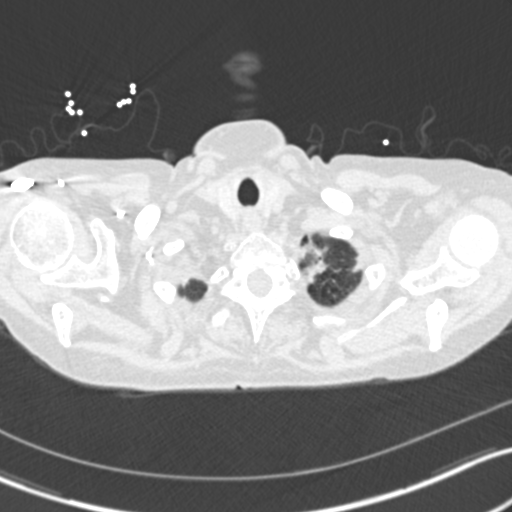

[Series 7: coronal mpr · coronal · 0.63mm/px · 1 of 121 slices shown]
[im 61/121  mediastinal]
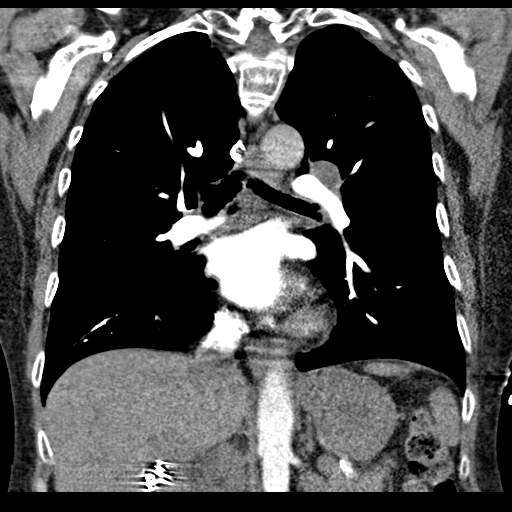

[18 of 36 positions shown; findings below may reference images not displayed]

FINDINGS: Cardiovascular: This is a technically adequate evaluation of the
pulmonary vasculature. No filling defects or pulmonary emboli.

The heart is unremarkable without pericardial effusion. Normal
caliber of the thoracic aorta.

Mediastinum/Nodes: Left hilar adenopathy measures up to 2.4 cm in
short axis. Thyroid, trachea, and esophagus are unremarkable.

Lungs/Pleura: There is a 9 mm right middle lobe pulmonary nodule
reference image 85. A large cavitating mass within the left upper
lobe measures up to 4.3 x 3.0 cm. Findings are consistent with
metastatic disease.

There is upper lobe predominant emphysema. No airspace disease,
effusion, or pneumothorax. Central airways are patent.

Upper Abdomen: Peripheral hypodensity right lobe liver reference
image 136 compatible with metastatic disease, measuring 2.1 cm in
size. Left renal cyst also noted.

Musculoskeletal: Lytic lesion within the L1 vertebral body
consistent with bony metastatic disease. Pathologic fracture through
the superior endplate with approximately 25% loss of height. No
retropulsion.

No other acute or destructive bony lesions. Reconstructed images
demonstrate no additional findings.

Review of the MIP images confirms the above findings.
IMPRESSION: 1. No evidence of pulmonary embolus.
2. Right middle lobe nodule, left upper lobe mass, and left hilar
adenopathy consistent with intrathoracic metastases.
3. Lytic lesion within the L1 vertebral body consistent with bony
metastatic disease. Pathologic fracture through the superior
endplate with approximately 25% loss of height. No retropulsion.
4. Right lobe liver metastatic focus.
5. Emphysema ([id number]).
